# Patient Record
Sex: Female | Born: 2000 | Race: Black or African American | Hispanic: No | Marital: Single | Smoking: Never smoker
Health system: Southern US, Community
[De-identification: ages and names within clinical notes are randomized; demographics above are authoritative.]

## PROBLEM LIST (undated history)

## (undated) HISTORY — PX: TONSILLECTOMY: SUR1361

---
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
## 2018-05-04 IMAGING — CR DG CHEST 1V
1 series · 1 of 1 positions shown · non-contrast
Comparison: None.

CLINICAL DATA: Right anterior rib pain for 4-5 days.

EXAM:
CHEST 1 VIEW

[w chest pa]
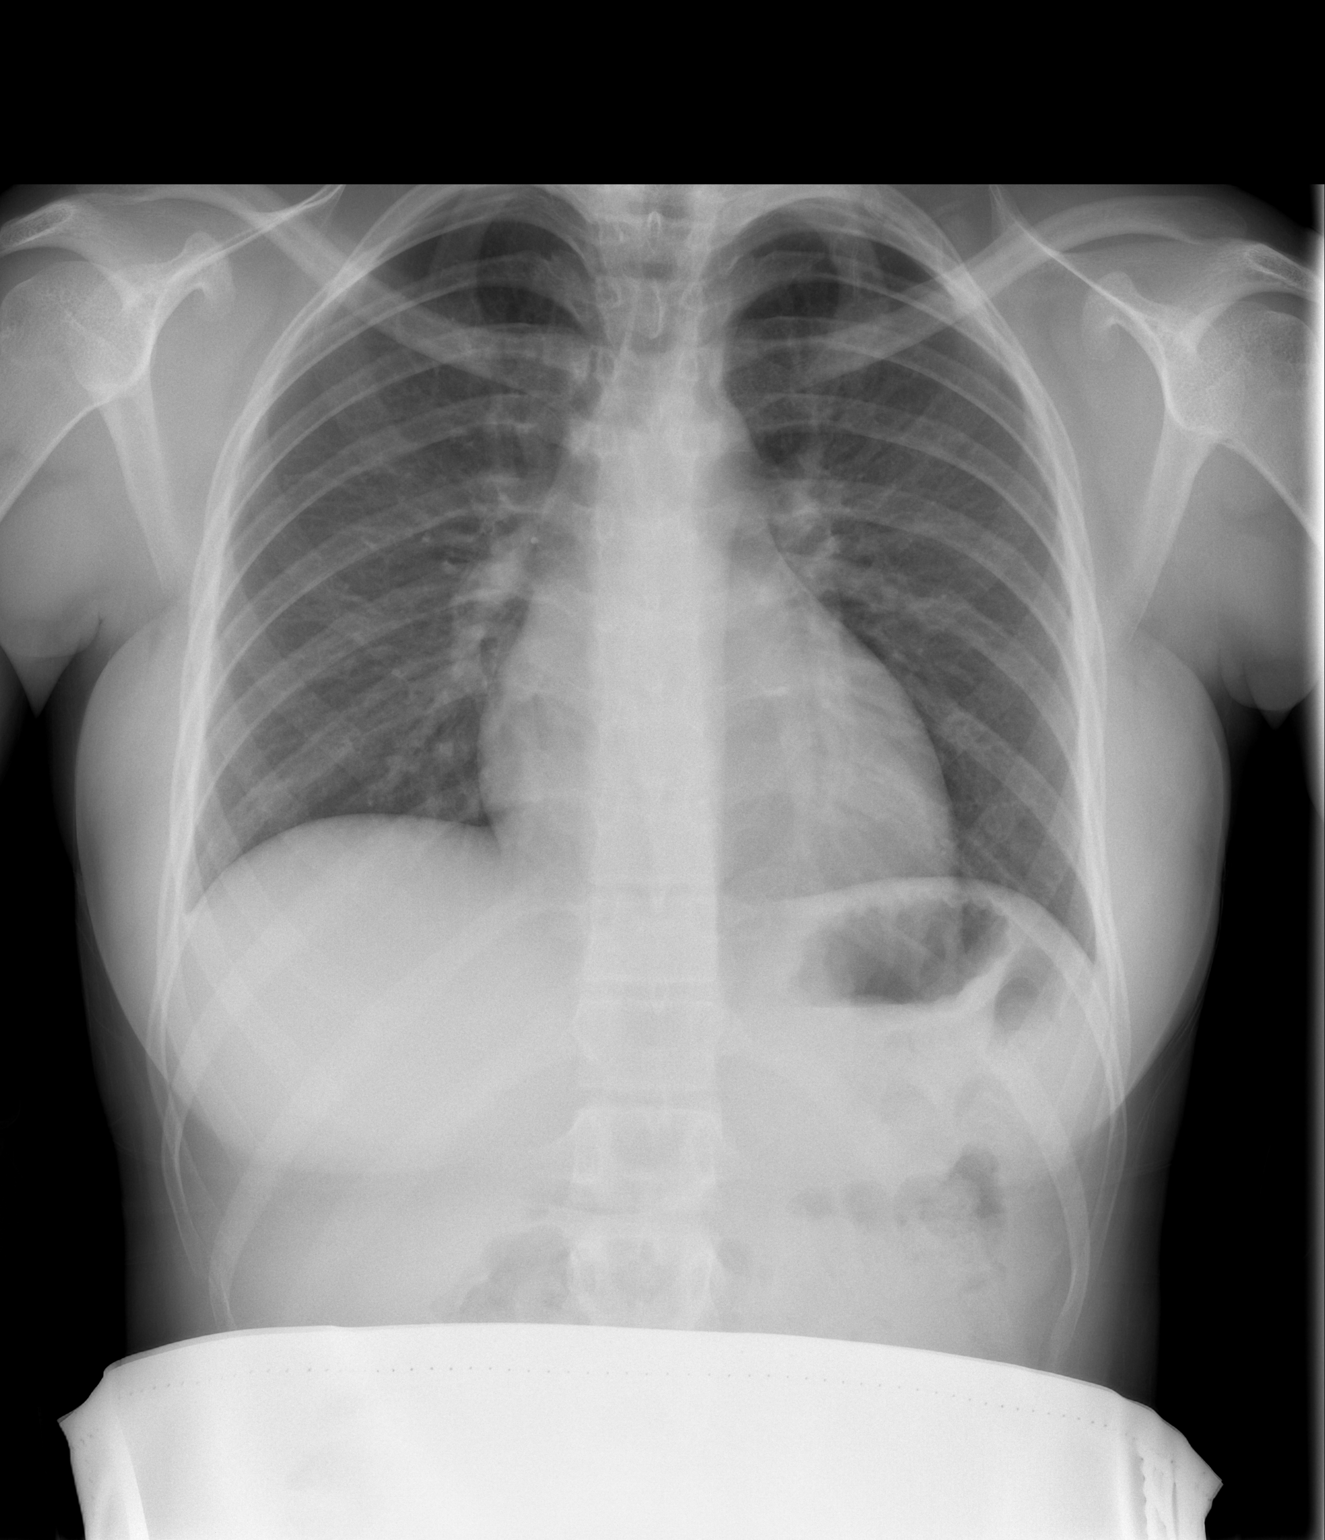

[1 of 1 positions shown; findings below may reference images not displayed]

FINDINGS: Trachea is midline. Heart size normal. Lungs are clear. No pleural
fluid. Osseous structures are grossly intact.
IMPRESSION: Negative.
